# Patient Record
Sex: Male | Born: 1980 | Hispanic: No | Marital: Married | Smoking: Never smoker
Health system: Southern US, Community
[De-identification: ages and names within clinical notes are randomized; demographics above are authoritative.]

---

## 2005-05-31 ENCOUNTER — Emergency Department (HOSPITAL_COMMUNITY): Admission: EM | Admit: 2005-05-31 | Discharge: 2005-05-31 | Payer: Self-pay | Admitting: Emergency Medicine

## 2005-06-19 ENCOUNTER — Emergency Department (HOSPITAL_COMMUNITY): Admission: EM | Admit: 2005-06-19 | Discharge: 2005-06-19 | Payer: Self-pay | Admitting: Emergency Medicine

## 2014-07-30 ENCOUNTER — Ambulatory Visit (INDEPENDENT_AMBULATORY_CARE_PROVIDER_SITE_OTHER): Payer: 59 | Admitting: Emergency Medicine

## 2014-07-30 ENCOUNTER — Ambulatory Visit (INDEPENDENT_AMBULATORY_CARE_PROVIDER_SITE_OTHER): Payer: 59

## 2014-07-30 VITALS — BP 116/70 | HR 100 | Temp 97.9°F | Resp 16 | Ht 63.75 in | Wt 165.2 lb

## 2014-07-30 DIAGNOSIS — M25531 Pain in right wrist: Secondary | ICD-10-CM

## 2014-07-30 DIAGNOSIS — M25539 Pain in unspecified wrist: Secondary | ICD-10-CM

## 2014-07-30 DIAGNOSIS — M654 Radial styloid tenosynovitis [de Quervain]: Secondary | ICD-10-CM

## 2014-07-30 MED ORDER — NAPROXEN SODIUM 550 MG PO TABS: 550.0000 mg | ORAL_TABLET | Freq: Two times a day (BID) | ORAL | Status: AC

## 2014-07-30 NOTE — Patient Instructions (Signed)
De Quervain's Tenosynovitis De Quervain's tenosynovitis involves inflammation of one or two tendon linings (sheaths) or strain of one or two tendons to the thumb: extensor pollicis brevis (EPB), or abductor pollicis longus (APL). This causes pain on the side of the wrist and base of the thumb. Tendon sheaths secrete a fluid that lubricates the tendon, allowing the tendon to move smoothly. When the sheath becomes inflamed, the tendon cannot move freely in the sheath. Both the EPB and APL tendons are important for proper use of the hand. The EPB tendon is important for straightening the thumb. The APL tendon is important for moving the thumb away from the index finger (abducting). The two tendons pass through a small tube (canal) in the wrist, near the base of the thumb. When the tendons become inflamed, pain is usually felt in this area. SYMPTOMS   Pain, tenderness, swelling, warmth, or redness over the base of the thumb and thumb side of the wrist.  Pain that gets worse when straightening the thumb.  Pain that gets worse when moving the thumb away from the index finger, against resistance.  Pain with pinching or gripping.  Locking or catching of the thumb.  Limited motion of the thumb.  Crackling sound (crepitation) when the tendon or thumb is moved or touched.  Fluid-filled cyst in the area of the base of the thumb. CAUSES   Tenosynovitis is often linked with overuse of the wrist.  Tenosynovitis may be caused by repeated injury to the thumb muscle and tendon units, and with repeated motions of the hand and wrist, due to friction of the tendon within the lining (sheath).  Tenosynovitis may also be due to a sudden increase in activity or change in activity. RISK INCREASES WITH:  Sports that involve repeated hand and wrist motions (golf, bowling, tennis, squash, racquetball).  Heavy labor.  Poor physical wrist strength and flexibility.  Failure to warm up properly before practice or  play.  Male gender.  New mothers who hold their baby's head for long periods or lift infants with thumbs in the infant's armpit (axilla). PREVENTION  Warm up and stretch properly before practice or competition.  Allow enough time for rest and recovery between practices and competition.  Maintain appropriate conditioning:  Cardiovascular fitness.  Forearm, wrist, and hand flexibility.  Muscle strength and endurance.  Use proper exercise technique. PROGNOSIS  This condition is usually curable within 6 weeks, if treated properly with non-surgical treatment and resting of the affected area.  RELATED COMPLICATIONS   Longer healing time if not properly treated or if not given enough time to heal.  Chronic inflammation, causing recurring symptoms of tenosynovitis. Permanent pain or restriction of movement.  Risks of surgery: infection, bleeding, injury to nerves (numbness of the thumb), continued pain, incomplete release of the tendon sheath, recurring symptoms, cutting of the tendons, tendons sliding out of position, weakness of the thumb, thumb stiffness. TREATMENT  First, treatment involves the use of medicine and ice, to reduce pain and inflammation. Patients are encouraged to stop or modify activities that aggravate the injury. Stretching and strengthening exercises may be advised. Exercises may be completed at home or with a therapist. You may be fitted with a brace or splint, to limit motion and allow the injury to heal. Your caregiver may also choose to give you a corticosteroid injection, to reduce the pain and inflammation. If non-surgical treatment is not successful, surgery may be needed. Most tenosynovitis surgeries are done as outpatient procedures (you go home the   same day). Surgery may involve local, regional (whole arm), or general anesthesia.  MEDICATION   If pain medicine is needed, nonsteroidal anti-inflammatory medicines (aspirin and ibuprofen), or other minor pain  relievers (acetaminophen), are often advised.  Do not take pain medicine for 7 days before surgery.  Prescription pain relievers are often prescribed only after surgery. Use only as directed and only as much as you need.  Corticosteroid injections may be given if your caregiver thinks they are needed. There is a limited number of times these injections may be given. COLD THERAPY   Cold treatment (icing) should be applied for 10 to 15 minutes every 2 to 3 hours for inflammation and pain, and immediately after activity that aggravates your symptoms. Use ice packs or an ice massage. SEEK MEDICAL CARE IF:   Symptoms get worse or do not improve in 2 to 4 weeks, despite treatment.  You experience pain, numbness, or coldness in the hand.  Blue, gray, or dark color appears in the fingernails.  Any of the following occur after surgery: increased pain, swelling, redness, drainage of fluids, bleeding in the affected area, or signs of infection.  New, unexplained symptoms develop. (Drugs used in treatment may produce side effects.) Document Released: 11/09/2005 Document Revised: 02/01/2012 Document Reviewed: 02/21/2009 Exit[employer] Patient Information 2015 [employer]. This information is not intended to replace advice given to you by your health care provider. Make sure you discuss any questions you have with your health care provider.   

## 2014-07-30 NOTE — Progress Notes (Signed)
[facility] [address], [place] [address] [phone]  Date:  07/30/2014   Name:  Martin Robles   DOB:  January 11, 1981   MRN:  [id number]  PCP:  No primary provider on file.    Chief Complaint: Wrist Pain   History of Present Illness:  Martin Robles is a 33 y.o. very pleasant male patient who presents with the following:  Works as a Psychologist, occupational and does a lot of lifting.  No history of injury Has pain in the dorsal wrist for the past week. Some swelling and pain increases with use. No improvement with over the counter medications or other home remedies.  Denies other complaint or health concern today.   There are no active problems to display for this patient.   History reviewed. No pertinent past medical history.  History reviewed. No pertinent past surgical history.  History  Substance Use Topics  . Smoking status: Never Smoker   . Smokeless tobacco: Not on file  . Alcohol Use: No    History reviewed. No pertinent family history.  No Known Allergies  Medication list has been reviewed and updated.  No current outpatient prescriptions on file prior to visit.   No current facility-administered medications on file prior to visit.    Review of Systems:  As per HPI, otherwise negative.    Physical Examination: Filed Vitals:   07/30/14 1148  BP: 116/70  Pulse: 100  Temp: 97.9 F (36.6 C)  Resp: 16   Filed Vitals:   07/30/14 1148  Height: 5' 3.75" (1.619 m)  Weight: 165 lb 3.2 oz (74.934 kg)   Body mass index is 28.59 kg/(m^2). Ideal Body Weight: Weight in (lb) to have BMI = 25: 144.2   GEN: WDWN, NAD, Non-toxic, Alert & Oriented x 3 HEENT: Atraumatic, Normocephalic.  Ears and Nose: No external deformity. EXTR: No clubbing/cyanosis/edema NEURO: Normal gait.  PSYCH: Normally interactive. Conversant. Not depressed or anxious appearing.  Calm demeanor.  RIGHT wrist:  Ulnar ganglion cyst.  Not tender.  Tender in radial wrist with  positive Finklestein test.  Assessment and Plan: dequervain's Thumb spica Anaprox Follow up in one week.  Signed,  Phillips Odor, MD   [facility] reading (PRIMARY) by  Dr. Dareen Piano.  negative.

## 2015-07-14 IMAGING — CR DG WRIST COMPLETE 3+V*R*
2 series · 2 of 2 positions shown · non-contrast
Comparison: None.

CLINICAL DATA: Right wrist pain.

EXAM:
RIGHT WRIST - COMPLETE 3+ VIEW

[PA]
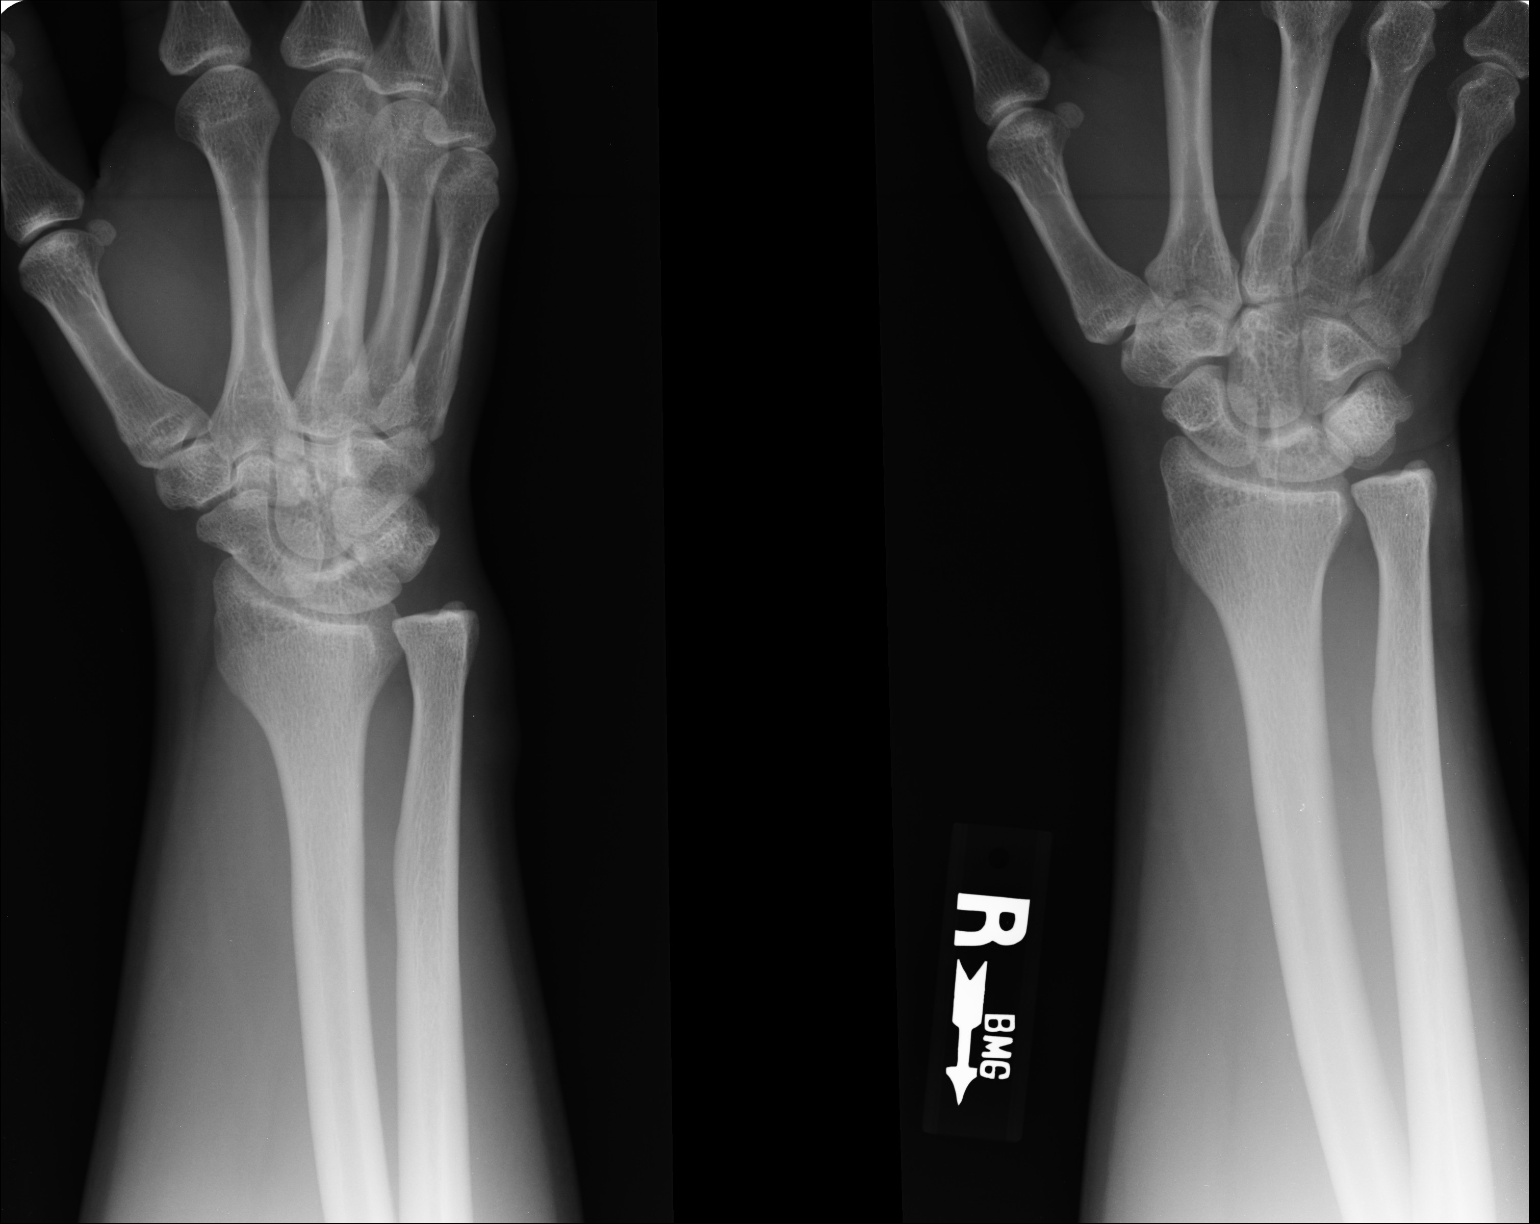

[lateral]
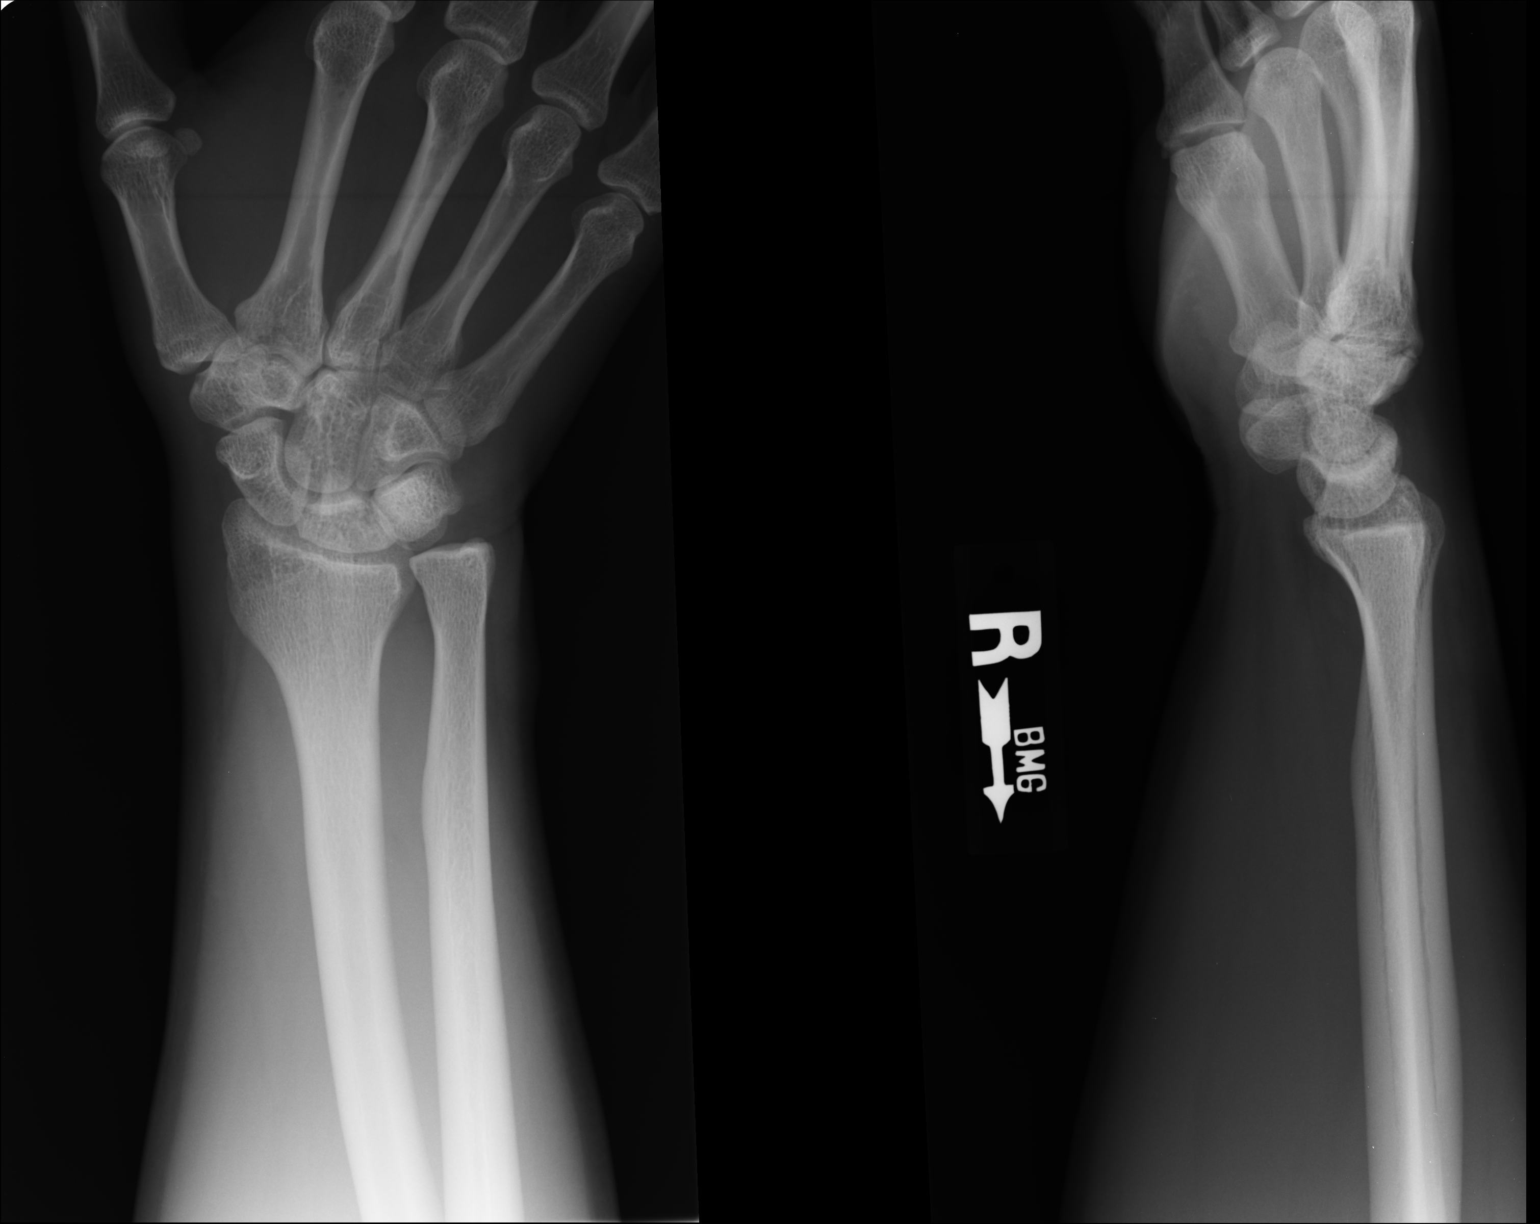

[2 of 2 positions shown; findings below may reference images not displayed]

FINDINGS: There is no evidence of fracture or dislocation. There is no
evidence of arthropathy or other focal bone abnormality. Soft
tissues are unremarkable.
IMPRESSION: Negative.

## 2020-08-01 ENCOUNTER — Ambulatory Visit (HOSPITAL_COMMUNITY)
Admission: EM | Admit: 2020-08-01 | Discharge: 2020-08-01 | Disposition: A | Discharge status: Home / Self Care | Payer: Self-pay

## 2020-08-01 ENCOUNTER — Other Ambulatory Visit: Payer: Self-pay

## 2020-08-03 ENCOUNTER — Ambulatory Visit (HOSPITAL_COMMUNITY)
Admission: EM | Admit: 2020-08-03 | Discharge: 2020-08-03 | Disposition: A | Discharge status: Home / Self Care | Payer: Self-pay | Attending: Emergency Medicine | Admitting: Emergency Medicine

## 2020-08-03 ENCOUNTER — Encounter (HOSPITAL_COMMUNITY): Payer: Self-pay

## 2020-08-03 ENCOUNTER — Other Ambulatory Visit: Payer: Self-pay

## 2020-08-03 DIAGNOSIS — Z79899 Other long term (current) drug therapy: Secondary | ICD-10-CM | POA: Insufficient documentation

## 2020-08-03 DIAGNOSIS — R05 Cough: Secondary | ICD-10-CM | POA: Insufficient documentation

## 2020-08-03 DIAGNOSIS — Z20822 Contact with and (suspected) exposure to covid-19: Secondary | ICD-10-CM | POA: Insufficient documentation

## 2020-08-03 DIAGNOSIS — R059 Cough, unspecified: Secondary | ICD-10-CM

## 2020-08-03 MED ORDER — BENZONATATE 100 MG PO CAPS: 100.0000 mg | ORAL_CAPSULE | Freq: Three times a day (TID) | ORAL | 0 refills | Status: AC

## 2020-08-03 NOTE — Discharge Instructions (Signed)
Take the Tessalon Perles as needed for cough.    Your COVID test is pending.  You should self quarantine until the test result is back.    Take Tylenol as needed for fever or discomfort.  Rest and keep yourself hydrated.    Go to the emergency department if you develop acute worsening symptoms.     

## 2020-08-03 NOTE — ED Triage Notes (Signed)
Pt c/o non productive coughx6 days. Pt denies any other sx.

## 2020-08-03 NOTE — ED Provider Notes (Signed)
[facility]    CSN: [id number] Arrival date & time: 08/03/20  1232      History   Chief Complaint Chief Complaint  Patient presents with  . Cough    HPI Martin Robles is a 39 y.o. male.   Patient presents with nonproductive cough x1 week.  The cough is worse when he drinks water at work.  He denies fever, chills, sore throat, shortness of breath, vomiting, diarrhea, rash, or other symptoms.  No treatments attempted at home.  Patient denies pertinent medical history.  The history is provided by the patient.    History reviewed. No pertinent past medical history.  There are no problems to display for this patient.   History reviewed. No pertinent surgical history.     Home Medications    Prior to Admission medications   Medication Sig Start Date End Date Taking? Authorizing Provider  benzonatate (TESSALON) 100 MG capsule Take 1 capsule (100 mg total) by mouth every 8 (eight) hours. 08/03/20   Mickie Bail, NP    Family History No family history on file.  Social History Social History   Tobacco Use  . Smoking status: Never Smoker  . Smokeless tobacco: Never Used  Substance Use Topics  . Alcohol use: No  . Drug use: Never     Allergies   Patient has no known allergies.   Review of Systems Review of Systems  Constitutional: Negative for chills and fever.  HENT: Negative for ear pain and sore throat.   Eyes: Negative for pain and visual disturbance.  Respiratory: Positive for cough. Negative for shortness of breath.   Cardiovascular: Negative for chest pain and palpitations.  Gastrointestinal: Negative for abdominal pain and vomiting.  Genitourinary: Negative for dysuria and hematuria.  Musculoskeletal: Negative for arthralgias and back pain.  Skin: Negative for color change and rash.  Neurological: Negative for seizures and syncope.  All other systems reviewed and are negative.    Physical Exam Triage Vital Signs ED Triage Vitals  Enc  Vitals Group     BP 08/03/20 1416 137/77     Pulse Rate 08/03/20 1416 86     Resp 08/03/20 1416 16     Temp 08/03/20 1416 98.7 F (37.1 C)     Temp Source 08/03/20 1416 Oral     SpO2 08/03/20 1416 100 %     Weight 08/03/20 1417 153 lb (69.4 kg)     Height 08/03/20 1417 5\' 4"  (1.626 m)     Head Circumference --      Peak Flow --      Pain Score 08/03/20 1417 0     Pain Loc --      Pain Edu? --      Excl. in GC? --    No data found.  Updated Vital Signs BP 137/77   Pulse 86   Temp 98.7 F (37.1 C) (Oral)   Resp 16   Ht 5\' 4"  (1.626 m)   Wt 153 lb (69.4 kg)   SpO2 100%   BMI 26.26 kg/m   Visual Acuity Right Eye Distance:   Left Eye Distance:   Bilateral Distance:    Right Eye Near:   Left Eye Near:    Bilateral Near:     Physical Exam Vitals and nursing note reviewed.  Constitutional:      General: He is not in acute distress.    Appearance: He is well-developed. He is not ill-appearing.  HENT:     Head:  Normocephalic and atraumatic.     Right Ear: Tympanic membrane normal.     Left Ear: Tympanic membrane normal.     Nose: Nose normal.     Mouth/Throat:     Mouth: Mucous membranes are moist.     Pharynx: Oropharynx is clear.  Eyes:     Conjunctiva/sclera: Conjunctivae normal.  Cardiovascular:     Rate and Rhythm: Normal rate and regular rhythm.     Heart sounds: No murmur heard.   Pulmonary:     Effort: Pulmonary effort is normal. No respiratory distress.     Breath sounds: Normal breath sounds.  Abdominal:     Palpations: Abdomen is soft.     Tenderness: There is no abdominal tenderness. There is no guarding or rebound.  Musculoskeletal:     Cervical back: Neck supple.  Skin:    General: Skin is warm and dry.     Findings: No rash.  Neurological:     General: No focal deficit present.     Mental Status: He is alert and oriented to person, place, and time.     Gait: Gait normal.  Psychiatric:        Mood and Affect: Mood normal.         Behavior: Behavior normal.      UC Treatments / Results  Labs (all labs ordered are listed, but only abnormal results are displayed) Labs Reviewed  SARS CORONAVIRUS 2 (TAT 6-24 HRS)    EKG   Radiology No results found.  Procedures Procedures (including critical care time)  Medications Ordered in UC Medications - No data to display  Initial Impression / Assessment and Plan / UC Course  I have reviewed the triage vital signs and the nursing notes.  Pertinent labs & imaging results that were available during my care of the patient were reviewed by me and considered in my medical decision making (see chart for details).   Cough.  Treating with Tessalon Perles.  PCR COVID pending.  Instructed patient to self quarantine until the test result is back.  Discussed symptomatic treatment including Tylenol, rest, hydration.  Instructed patient to go to the ED if he has acute worsening symptoms.  Patient agrees to plan of care.    Final Clinical Impressions(s) / UC Diagnoses   Final diagnoses:  Cough     Discharge Instructions     Take the Tessalon Perles as needed for cough.    Your COVID test is pending.  You should self quarantine until the test result is back.    Take Tylenol as needed for fever or discomfort.  Rest and keep yourself hydrated.    Go to the emergency department if you develop acute worsening symptoms.        ED Prescriptions    Medication Sig Dispense Auth. Provider   benzonatate (TESSALON) 100 MG capsule Take 1 capsule (100 mg total) by mouth every 8 (eight) hours. 21 capsule Mickie Bail, NP     PDMP not reviewed this encounter.   Mickie Bail, NP 08/03/20 1437

## 2020-08-04 LAB — SARS CORONAVIRUS 2 (TAT 6-24 HRS): SARS Coronavirus 2: NEGATIVE
